# Patient Record
Sex: Female | Born: 1980 | Race: Black or African American | Hispanic: No | Marital: Single | Smoking: Current every day smoker
Health system: Southern US, Community
[De-identification: ages and names within clinical notes are randomized; demographics above are authoritative.]

---

## 2005-11-21 ENCOUNTER — Inpatient Hospital Stay: Payer: Self-pay | Admitting: Obstetrics and Gynecology

## 2007-12-12 ENCOUNTER — Emergency Department: Payer: Self-pay | Admitting: Emergency Medicine

## 2010-05-01 ENCOUNTER — Emergency Department: Payer: Self-pay | Admitting: Emergency Medicine

## 2010-05-02 ENCOUNTER — Emergency Department: Payer: Self-pay | Admitting: Emergency Medicine

## 2011-04-15 ENCOUNTER — Emergency Department: Payer: Self-pay | Admitting: Emergency Medicine

## 2011-08-14 IMAGING — CT CT STONE STUDY
1 of 2 series · 15 of 32 positions shown, 19 images · non-contrast
Comparison: none

REASON FOR EXAM: L flank pain, hematuria
COMMENTS:

[Series 2: soft tissue · axial · 0.71mm/px · z∈[-594,-212]mm · 15 of 139 slices shown, 19 images]
[im 6/139  soft-tissue]
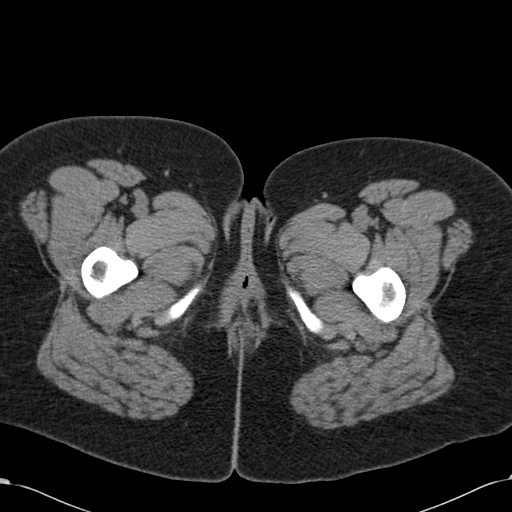
[im 6/139  bone]
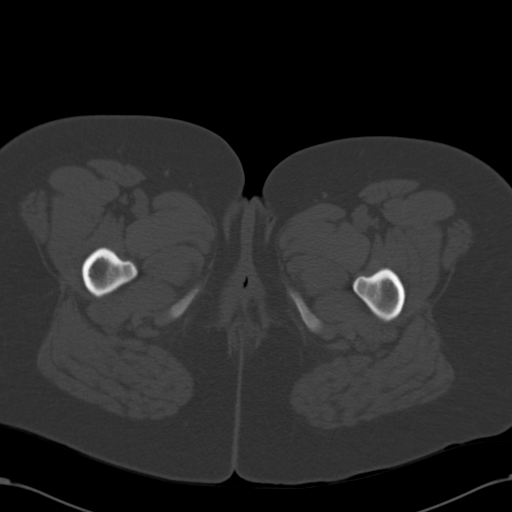
[im 16/139  soft-tissue]
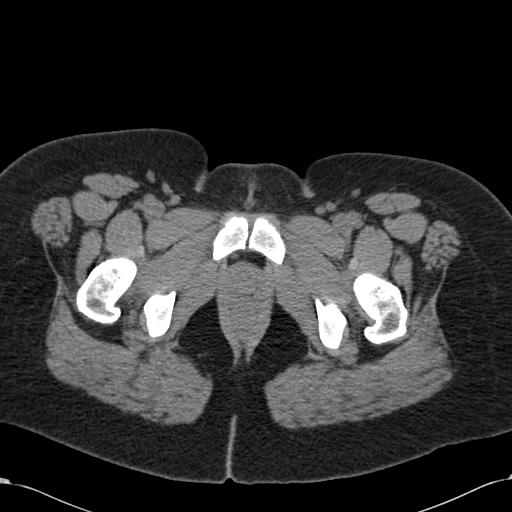
[im 27/139  soft-tissue]
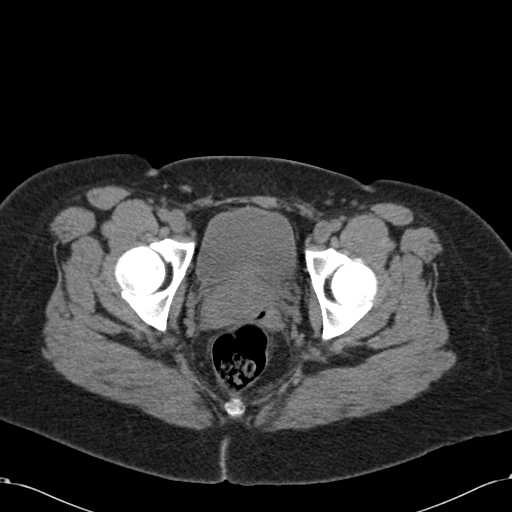
[im 38/139  soft-tissue]
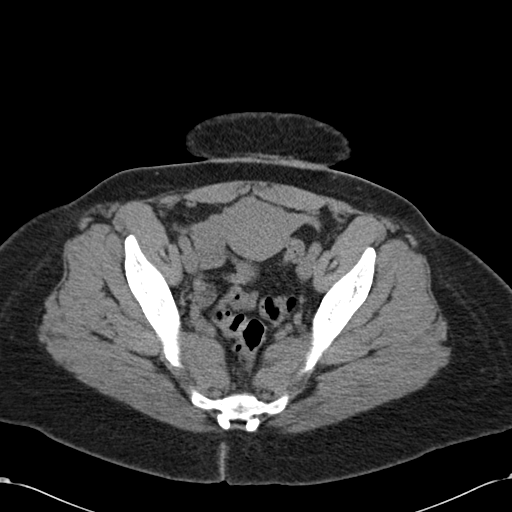
[im 48/139  soft-tissue]
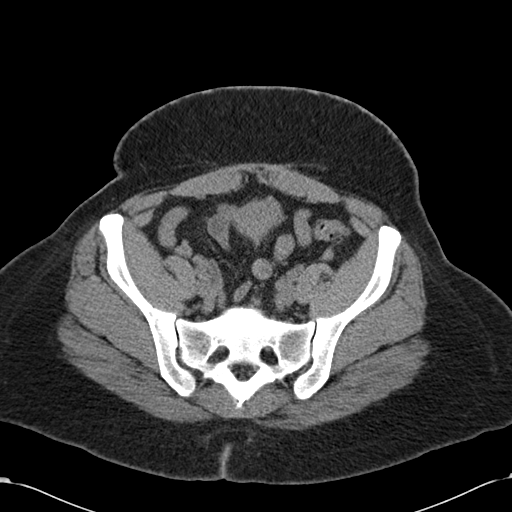
[im 59/139  soft-tissue]
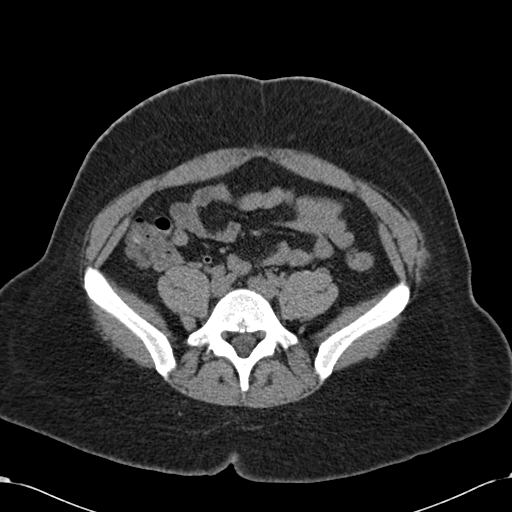
[im 70/139  soft-tissue]
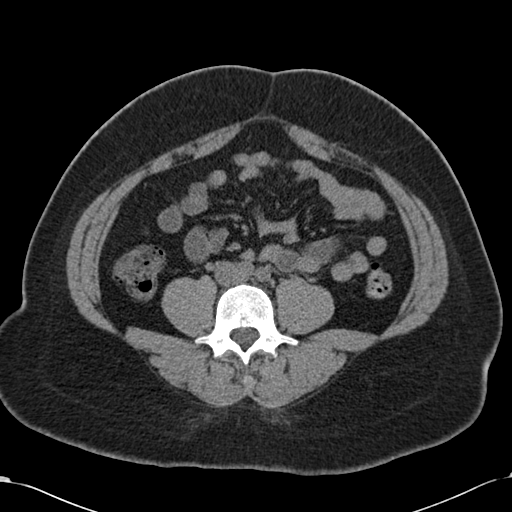
[im 80/139  soft-tissue]
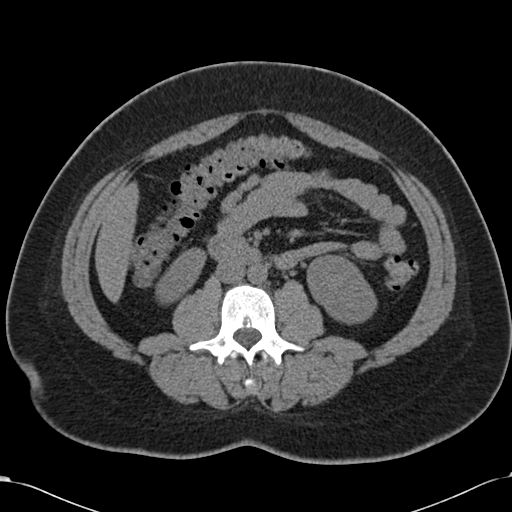
[im 91/139  soft-tissue]
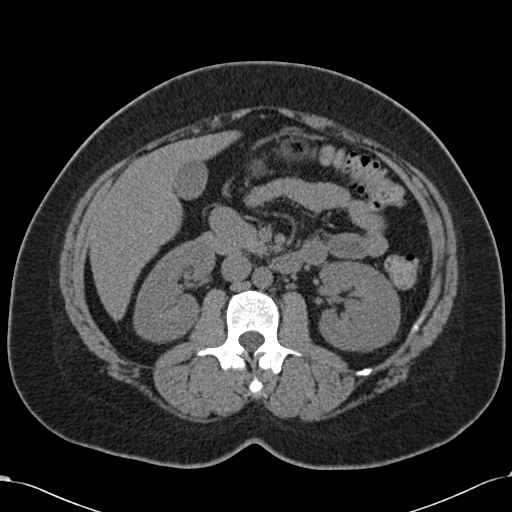
[im 91/139  bone]
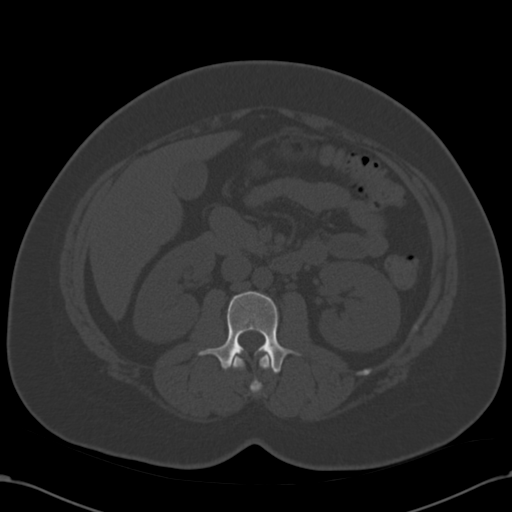
[im 101/139  soft-tissue]
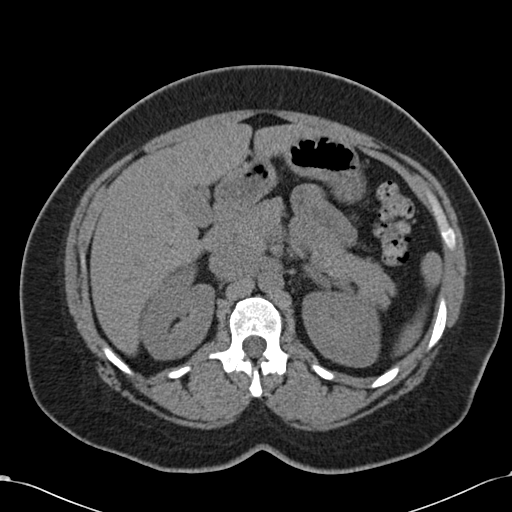
[im 112/139  soft-tissue]
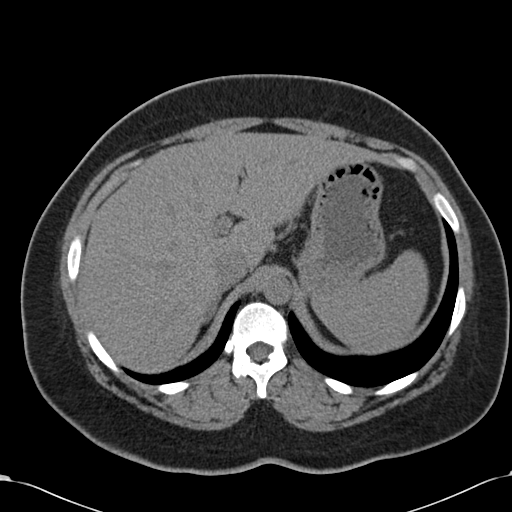
[im 117/139  lung]
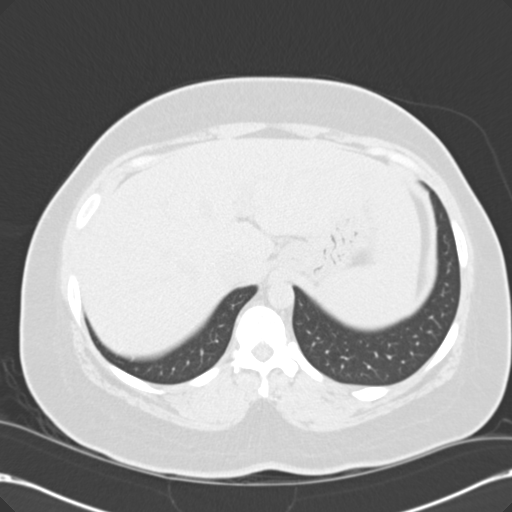
[im 123/139  soft-tissue]
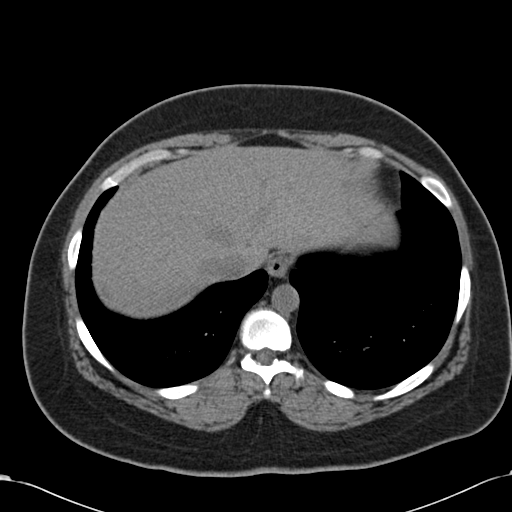
[im 123/139  lung]
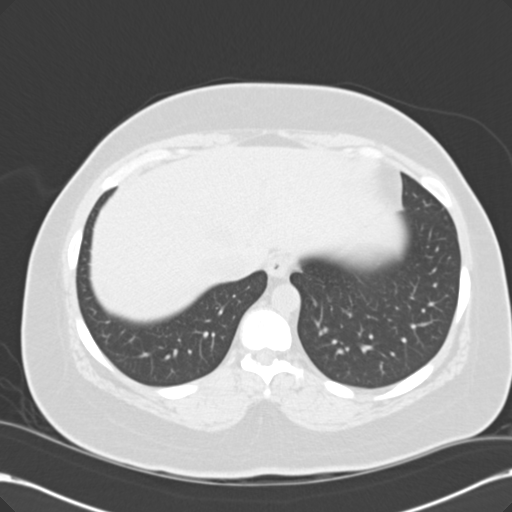
[im 128/139  lung]
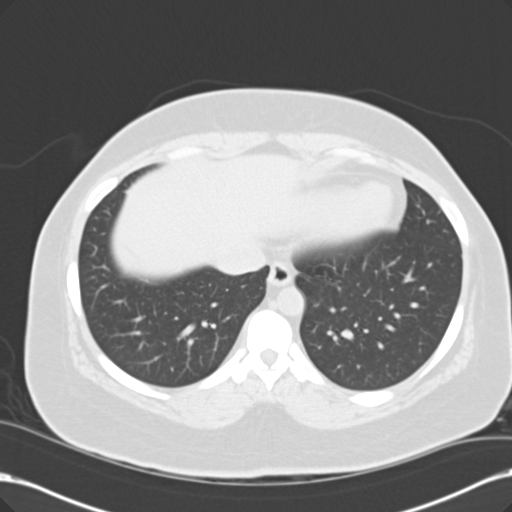
[im 133/139  soft-tissue]
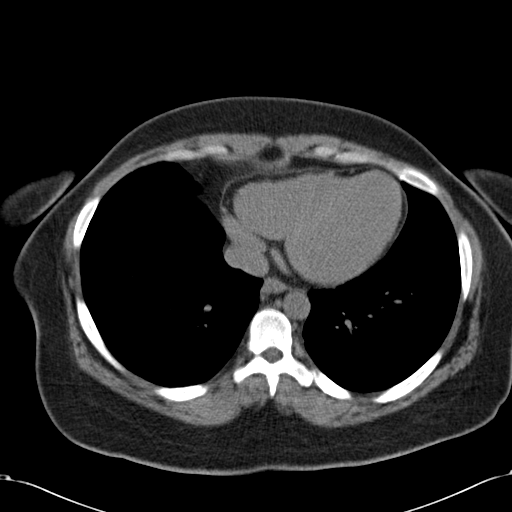
[im 133/139  lung]
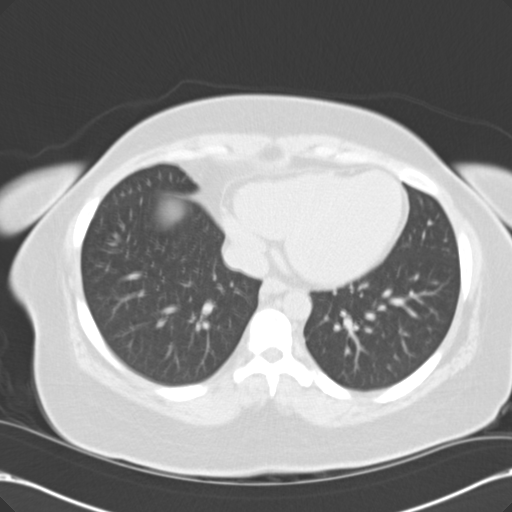

[15 of 32 positions shown; findings below may reference images not displayed]

PROCEDURE:     CT  - CT ABDOMEN /PELVIS WO (STONE)  - May 02, 2010  [DATE]

RESULT:     Noncontrast CT of the abdomen and pelvis is performed utilizing
a renal stone protocol. Images are reconstructed in the axial plane at 3 mm
slice thickness. The patient has no previous similar study for comparison.

There does not appear to be evidence of urinary tract obstruction or
nephrolithiasis. The appendix is normal in caliber. No radiopaque gallstones
are evident. No hydroureter is appreciated. Some scattered small colonic
diverticula are present. There is evidence of a low-attenuation area in the
right adnexa measuring 2.21 cm suggestive of a right ovarian cyst. No
ureteral calculi are definitely identified. There is a calcific density on
image #115 which could be in the distal right ureter or could be an adjacent
phlebolith. This area measures approximately 2.0 mm. There is no abnormal
bowel distention or bowel wall thickening. The liver, gallbladder, pancreas,
spleen, kidneys and adrenal glands appear to be unremarkable area the lung
bases show respiratory motion artifact with otherwise normal aeration.
IMPRESSION: 1. Cannot exclude a nonobstructing 2 mm distal right ureteral stone versus
phlebolith adjacent to the distal right ureter as described. Otherwise,
scattered small colonic diverticula without evidence of acute
diverticulitis. These are most prominently seen in the transverse colon.
Normal-appearing appendix.
2. Right ovarian cyst.

## 2012-12-14 ENCOUNTER — Emergency Department: Payer: Self-pay | Admitting: Emergency Medicine

## 2012-12-14 LAB — COMPREHENSIVE METABOLIC PANEL
Albumin: 3.8 g/dL (ref 3.4–5.0)
Alkaline Phosphatase: 66 U/L (ref 50–136)
Anion Gap: 11 (ref 7–16)
Calcium, Total: 8.9 mg/dL (ref 8.5–10.1)
Co2: 22 mmol/L (ref 21–32)
Creatinine: 0.5 mg/dL — ABNORMAL LOW (ref 0.60–1.30)
EGFR (Non-African Amer.): >60
Glucose: 97 mg/dL (ref 65–99)
Osmolality: 279 (ref 275–301)
Potassium: 3.2 mmol/L — ABNORMAL LOW (ref 3.5–5.1)
SGPT (ALT): 24 U/L (ref 12–78)
Sodium: 139 mmol/L (ref 136–145)
Total Protein: 7.7 g/dL (ref 6.4–8.2)

## 2012-12-14 LAB — URINALYSIS, COMPLETE
Bilirubin,UR: NEGATIVE
Leukocyte Esterase: NEGATIVE
Nitrite: NEGATIVE
Ph: 5 (ref 4.5–8.0)
Protein: NEGATIVE
WBC UR: 4 /HPF (ref 0–5)

## 2012-12-14 LAB — CBC WITH DIFFERENTIAL/PLATELET
Basophil #: 0 10*3/uL (ref 0.0–0.1)
Basophil %: 0.3 %
Eosinophil #: 0.1 10*3/uL (ref 0.0–0.7)
HCT: 36.8 % (ref 35.0–47.0)
HGB: 11.8 g/dL — ABNORMAL LOW (ref 12.0–16.0)
Lymphocyte #: 1.9 10*3/uL (ref 1.0–3.6)
Lymphocyte %: 13.3 %
MCH: 24.4 pg — ABNORMAL LOW (ref 26.0–34.0)
MCHC: 31.9 g/dL — ABNORMAL LOW (ref 32.0–36.0)
MCV: 77 fL — ABNORMAL LOW (ref 80–100)
Monocyte #: 1 x10 3/mm — ABNORMAL HIGH (ref 0.2–0.9)
Monocyte %: 7.5 %
Neutrophil #: 10.9 10*3/uL — ABNORMAL HIGH (ref 1.4–6.5)
Neutrophil %: 78.3 %
RBC: 4.81 10*6/uL (ref 3.80–5.20)
RDW: 14.4 % (ref 11.5–14.5)

## 2012-12-14 LAB — PREGNANCY, URINE: Pregnancy Test, Urine: NEGATIVE m[IU]/mL

## 2012-12-14 LAB — LIPASE, BLOOD: Lipase: 147 U/L (ref 73–393)

## 2014-07-27 ENCOUNTER — Ambulatory Visit: Payer: Self-pay | Admitting: Obstetrics and Gynecology

## 2014-07-27 LAB — BASIC METABOLIC PANEL
ANION GAP: 8 (ref 7–16)
BUN: 11 mg/dL (ref 7–18)
CREATININE: 0.64 mg/dL (ref 0.60–1.30)
Calcium, Total: 8.8 mg/dL (ref 8.5–10.1)
Chloride: 106 mmol/L (ref 98–107)
Co2: 23 mmol/L (ref 21–32)
EGFR (Non-African Amer.): >60
GLUCOSE: 111 mg/dL — AB (ref 65–99)
Osmolality: 274 (ref 275–301)
POTASSIUM: 3.5 mmol/L (ref 3.5–5.1)
Sodium: 137 mmol/L (ref 136–145)

## 2014-07-27 LAB — HEMOGLOBIN: HGB: 11.4 g/dL — ABNORMAL LOW (ref 12.0–16.0)

## 2014-08-01 ENCOUNTER — Ambulatory Visit: Payer: Self-pay | Admitting: Obstetrics and Gynecology

## 2014-08-03 LAB — PATHOLOGY REPORT

## 2015-02-18 NOTE — Op Note (Signed)
PATIENT NAME:  Emma BougieJACOBS, Michelina MR#:  [id number] DATE OF BIRTH:  [birth year]  DATE OF PROCEDURE:  08/01/2014  PREOPERATIVE DIAGNOSIS:  Cervical carcinoma in situ.   POSTOPERATIVE DIAGNOSIS:  Cervical carcinoma in situ.   PROCEDURE PERFORMED:  1.  Cervical cold knife conization.  2.  Endocervical curettage.   SURGEON: Suzy Bouchardhomas J. Nico Rogness, MD   ANESTHESIA: General endotracheal anesthesia.   INDICATIONS: A 34 year old gravida 2, para 2 patient with a recent Pap smear that showed carcinoma in situ. 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   PROCEDURE IN DETAIL: After adequate general endotracheal anesthesia, the patient was placed in the dorsal supine position. The patient's perineum and vagina were prepped in normal fashion. Straight catheterization of the bladder yielded 50 mL of urine. A weighted speculum was placed in the posterior vaginal vault, and the anterior cervix was grasped with a single-tooth tenaculum. Two stay sutures were placed at the 3 and 9 o'clock positions to secure the cervical branch of the uterine artery with 0 Vicryl suture used. The cervix was then painted with Lugol solution that did not show any abnormal uptake. A small cervical os was noted. The cervix was then circumferentially injected with 1% lidocaine with epinephrine. Conization was performed in a standard fashion with adequate tissue removed. A suture was placed in the specimen at 1 o'clock for pathologic review. An endocervical curettage was performed above this. Good hemostasis was noted. The Bovie was used, as well as a stringent for hemostasis. Gelfoam was placed in the cervical bed, and the stay sutures were approximated centrally.   There were no complications.   Estimated blood loss minimal.   Intraoperative fluids 300 mL.   The patient was taken to the recovery room in good condition.    ____________________________ Suzy Bouchardhomas J.  Ryleah Miramontes, MD tjs:MT D: 08/01/2014 08:12:56 ET T: 08/01/2014 10:42:57 ET JOB#: [id number]  cc: Suzy Bouchardhomas J. Remmi Armenteros, MD, <Dictator> Suzy BouchardHOMAS J Elleen Coulibaly MD ELECTRONICALLY SIGNED 08/02/2014 20:11

## 2016-11-12 ENCOUNTER — Ambulatory Visit (INDEPENDENT_AMBULATORY_CARE_PROVIDER_SITE_OTHER): Payer: 59

## 2016-11-12 ENCOUNTER — Ambulatory Visit (INDEPENDENT_AMBULATORY_CARE_PROVIDER_SITE_OTHER): Payer: 59 | Admitting: Podiatry

## 2016-11-12 ENCOUNTER — Encounter: Payer: Self-pay | Admitting: Podiatry

## 2016-11-12 VITALS — BP 141/91 | HR 86 | Temp 97.9°F | Resp 14

## 2016-11-12 DIAGNOSIS — R52 Pain, unspecified: Secondary | ICD-10-CM

## 2016-11-12 DIAGNOSIS — L84 Corns and callosities: Secondary | ICD-10-CM

## 2016-11-12 DIAGNOSIS — M79671 Pain in right foot: Secondary | ICD-10-CM

## 2016-11-12 DIAGNOSIS — M79672 Pain in left foot: Secondary | ICD-10-CM | POA: Diagnosis not present

## 2016-11-12 DIAGNOSIS — M7752 Other enthesopathy of left foot: Secondary | ICD-10-CM

## 2016-11-12 DIAGNOSIS — M722 Plantar fascial fibromatosis: Secondary | ICD-10-CM | POA: Diagnosis not present

## 2016-11-12 DIAGNOSIS — L851 Acquired keratosis [keratoderma] palmaris et plantaris: Secondary | ICD-10-CM

## 2016-11-12 MED ORDER — MELOXICAM 15 MG PO TABS: 15.0000 mg | ORAL_TABLET | Freq: Every day | ORAL | 1 refills | Status: AC

## 2016-11-12 NOTE — Progress Notes (Signed)
   Subjective:    Patient ID: Emma Duarte, female    DOB: 05-08-1981, 36 y.o.   MRN: [id number]  HPI    Review of Systems  All other systems reviewed and are negative.      Objective:   Physical Exam        Assessment & Plan:

## 2016-11-17 MED ORDER — BETAMETHASONE SOD PHOS & ACET 6 (3-3) MG/ML IJ SUSP: 3.0000 mg | Freq: Once | INTRAMUSCULAR | Status: AC

## 2016-11-17 NOTE — Progress Notes (Signed)
Patient ID: Emma Duarte, female   DOB: 11-Feb-1981, 36 y.o.   MRN: [id number]   Subjective: Patient presents today for pain and tenderness in the left foot. Patient states the foot pain has been hurting for several weeks now. Patient states that it hurts in the mornings with the first steps out of bed. Patient presents today for further treatment and evaluation  Objective: Physical Exam General: The patient is alert and oriented x3 in no acute distress.  Dermatology: Symptomatic hyperkeratotic corn lesion noted to the plantar aspect of the third MPJ right foot and fifth digit left foot. Skin is warm, dry and supple bilateral lower extremities. Negative for open lesions or macerations bilateral.   Vascular: Dorsalis Pedis and Posterior Tibial pulses palpable bilateral.  Capillary fill time is immediate to all digits.  Neurological: Epicritic and protective threshold intact bilateral.   Musculoskeletal: Tenderness to palpation at the medial calcaneal tubercale and through the insertion of the plantar fascia of the left foot. All other joints range of motion within normal limits bilateral. Strength 5/5 in all groups bilateral.  Pain on palpation and range of motion also noted to the fifth MPJ left foot consistent with capsulitis.  Radiographic exam:   Normal osseous mineralization. Joint spaces preserved. No fracture/dislocation/boney destruction. Calcaneal spur present with mild thickening of plantar fascia left. No other soft tissue abnormalities or radiopaque foreign bodies.   Assessment: 1. Plantar fasciitis left foot 2. Pain in left foot 3. Capsulitis fifth MPJ left foot 4. Bilateral painful callus lesions 2  Plan of Care:   1. Patient evaluated. Xrays reviewed.   2. Injection of 0.5cc Celestone soluspan injected into the left plantar fascia.  3. Instructed patient regarding therapies and modalities at home to alleviate symptoms.  4. Rx for meloxicam 15mg  PO given to patient.    5. Plantar fascial band dispensed 6. Injection of 0.5 mL Celestone Soluspan injected with fifth MPJ left foot 7. Excisional debridement of painful callus lesions was performed to the plantar aspect of the third MPJ right foot and fifth digit left foot 8. Return to clinic in 4 weeks   Felecia ShellingBrent M. Evans, DPM [facility]  Dr. Felecia ShellingBrent M. Evans, DPM    [address]                                        [place], [place] [address]                Office [phone]  Fax [phone]) [phone]

## 2016-12-10 ENCOUNTER — Ambulatory Visit (INDEPENDENT_AMBULATORY_CARE_PROVIDER_SITE_OTHER): Payer: 59 | Admitting: Podiatry

## 2016-12-10 DIAGNOSIS — M79672 Pain in left foot: Secondary | ICD-10-CM

## 2016-12-10 DIAGNOSIS — M722 Plantar fascial fibromatosis: Secondary | ICD-10-CM | POA: Diagnosis not present

## 2016-12-10 DIAGNOSIS — Q828 Other specified congenital malformations of skin: Secondary | ICD-10-CM | POA: Diagnosis not present

## 2016-12-10 MED ORDER — BETAMETHASONE SOD PHOS & ACET 6 (3-3) MG/ML IJ SUSP: 3.0000 mg | Freq: Once | INTRAMUSCULAR | Status: AC

## 2016-12-10 NOTE — Progress Notes (Signed)
   Subjective:  Patient presents today for follow-up evaluation of plantar fasciitis to the left foot as well as painful callus lesions to the bilateral feet. Patient states that the plantar fasciitis is doing a little bit better. She does complain of the callus lesions and states the very painful.    Objective/Physical Exam General: The patient is alert and oriented x3 in no acute distress.  Dermatology: Painful hyperkeratotic lesions noted to the plantar aspect of third MPJ right foot as well as the fifth digit left foot. Central nucleated core noted. Skin is warm, dry and supple bilateral lower extremities. Negative for open lesions or macerations.  Vascular: Palpable pedal pulses bilaterally. No edema or erythema noted. Capillary refill within normal limits.  Neurological: Epicritic and protective threshold grossly intact bilaterally.   Musculoskeletal Exam: Moderate pain on palpation noted to the medial calcaneal tubercle of the insertion of the plantar fascial left foot. Range of motion within normal limits to all pedal and ankle joints bilateral. Muscle strength 5/5 in all groups bilateral.    Assessment: #1 plantar fasciitis left foot #2 porokeratosis sub-third MPJ right foot  #3 porokeratosis fifth digit left foot #4 pain in bilateral feet   Plan of Care:  #1 Patient was evaluated. #2 injection of 0.5 mL Celestone Soluspan injected in the patient's left foot at the insertion of the plantar fascia into the medial calcaneal tubercle #3 excisional debridement of painful callus lesions was performed bilateral feet #4 recommend super feet insoles #5 return to clinic in 4 weeks   Felecia ShellingBrent M. Waino Mounsey, DPM [facility]  Dr. Felecia ShellingBrent M. Shian Goodnow, DPM    [address]                                        [place], [place] [address]                Office [phone]  Fax [phone]

## 2017-01-07 ENCOUNTER — Ambulatory Visit: Payer: 59 | Admitting: Podiatry

## 2020-06-26 ENCOUNTER — Ambulatory Visit: Payer: Self-pay | Attending: Internal Medicine

## 2020-06-26 DIAGNOSIS — Z23 Encounter for immunization: Secondary | ICD-10-CM

## 2020-06-26 NOTE — Progress Notes (Signed)
   Covid-19 Vaccination Clinic  Name:  Emma Duarte    MRN: [id number] DOB: December 21, 1980  06/26/2020  [name] was observed post Covid-19 immunization for 15 minutes without incident. She was provided with Vaccine Information Sheet and instruction to access the V-Safe system.   [name] was instructed to call 911 with any severe reactions post vaccine: Marland Kitchen Difficulty breathing  . Swelling of face and throat  . A fast heartbeat  . A bad rash all over body  . Dizziness and weakness   Immunizations Administered    Name Date Dose VIS Date Route   Pfizer COVID-19 Vaccine 06/26/2020  9:18 AM 0.3 mL 12/22/2018 Intramuscular   Manufacturer: [employer], [employer]   Lot: [id number]   NDC: [address]-9

## 2020-07-17 ENCOUNTER — Ambulatory Visit: Payer: Self-pay | Attending: Internal Medicine

## 2020-07-17 DIAGNOSIS — Z23 Encounter for immunization: Secondary | ICD-10-CM

## 2020-07-17 NOTE — Progress Notes (Signed)
   Covid-19 Vaccination Clinic  Name:  Emma Duarte    MRN: [id number] DOB: [birth year]  07/17/2020  Emma Duarte was observed post Covid-19 immunization for 15 minutes without incident. She was provided with Vaccine Information Sheet and instruction to access the V-Safe system.   Emma Duarte was instructed to call 911 with any severe reactions post vaccine: Marland Kitchen Difficulty breathing  . Swelling of face and throat  . A fast heartbeat  . A bad rash all over body  . Dizziness and weakness   Immunizations Administered    Name Date Dose VIS Date Route   Pfizer COVID-19 Vaccine 07/17/2020  9:41 AM 0.3 mL 12/22/2018 Intramuscular   Manufacturer: [employer], [employer]   Lot: [id number]   NDC: [address]-1
